# Patient Record
Sex: Male | Born: 1955 | Race: White | Hispanic: No | Marital: Married
Health system: Southern US, Community
[De-identification: ages and names within clinical notes are randomized; demographics above are authoritative.]

---

## 2006-07-20 ENCOUNTER — Ambulatory Visit: Payer: Self-pay | Admitting: Ophthalmology

## 2006-07-22 ENCOUNTER — Ambulatory Visit: Payer: Self-pay | Admitting: Ophthalmology

## 2006-11-14 IMAGING — CR DG CHEST 2V
1 series · 2 of 2 positions shown · non-contrast
Comparison: none

REASON FOR EXAM: possible sarcoidosis
COMMENTS:

[Series 1: view not recorded · 0.17mm/px · 2 of 2 slices shown]
[im 1/2]
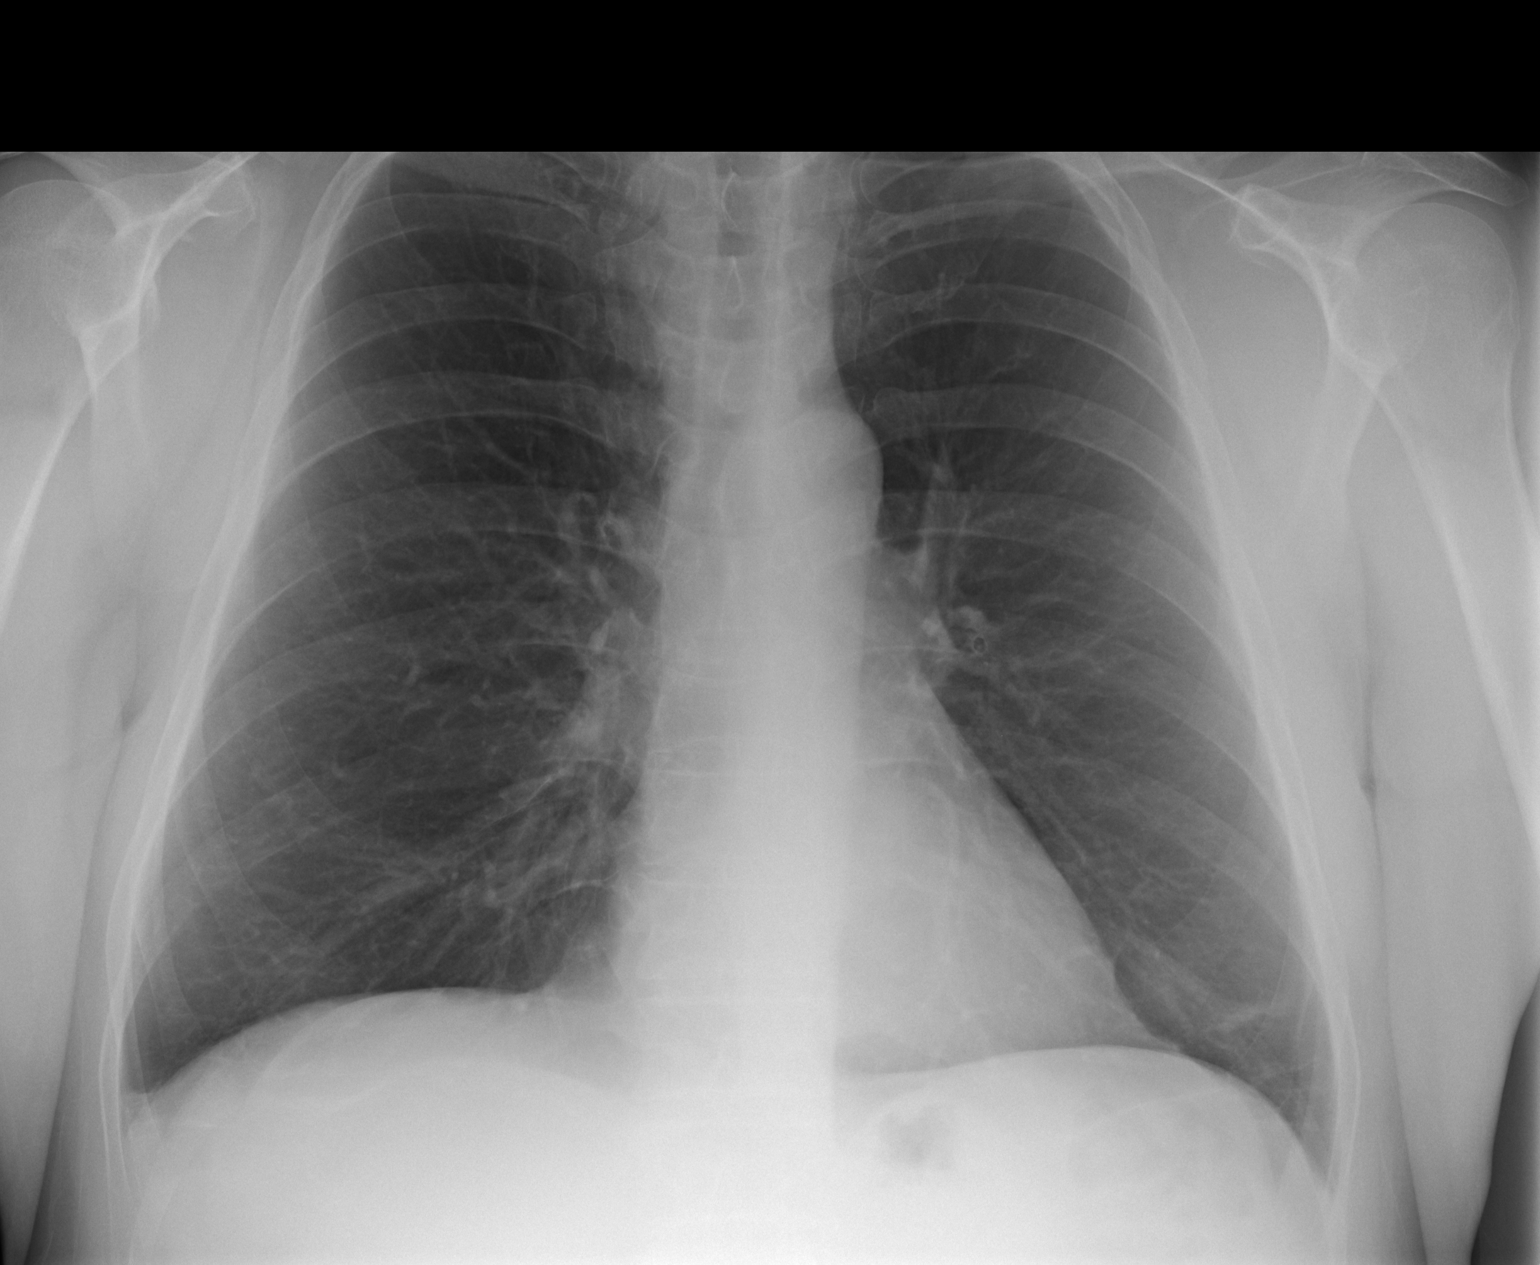
[im 2/2]
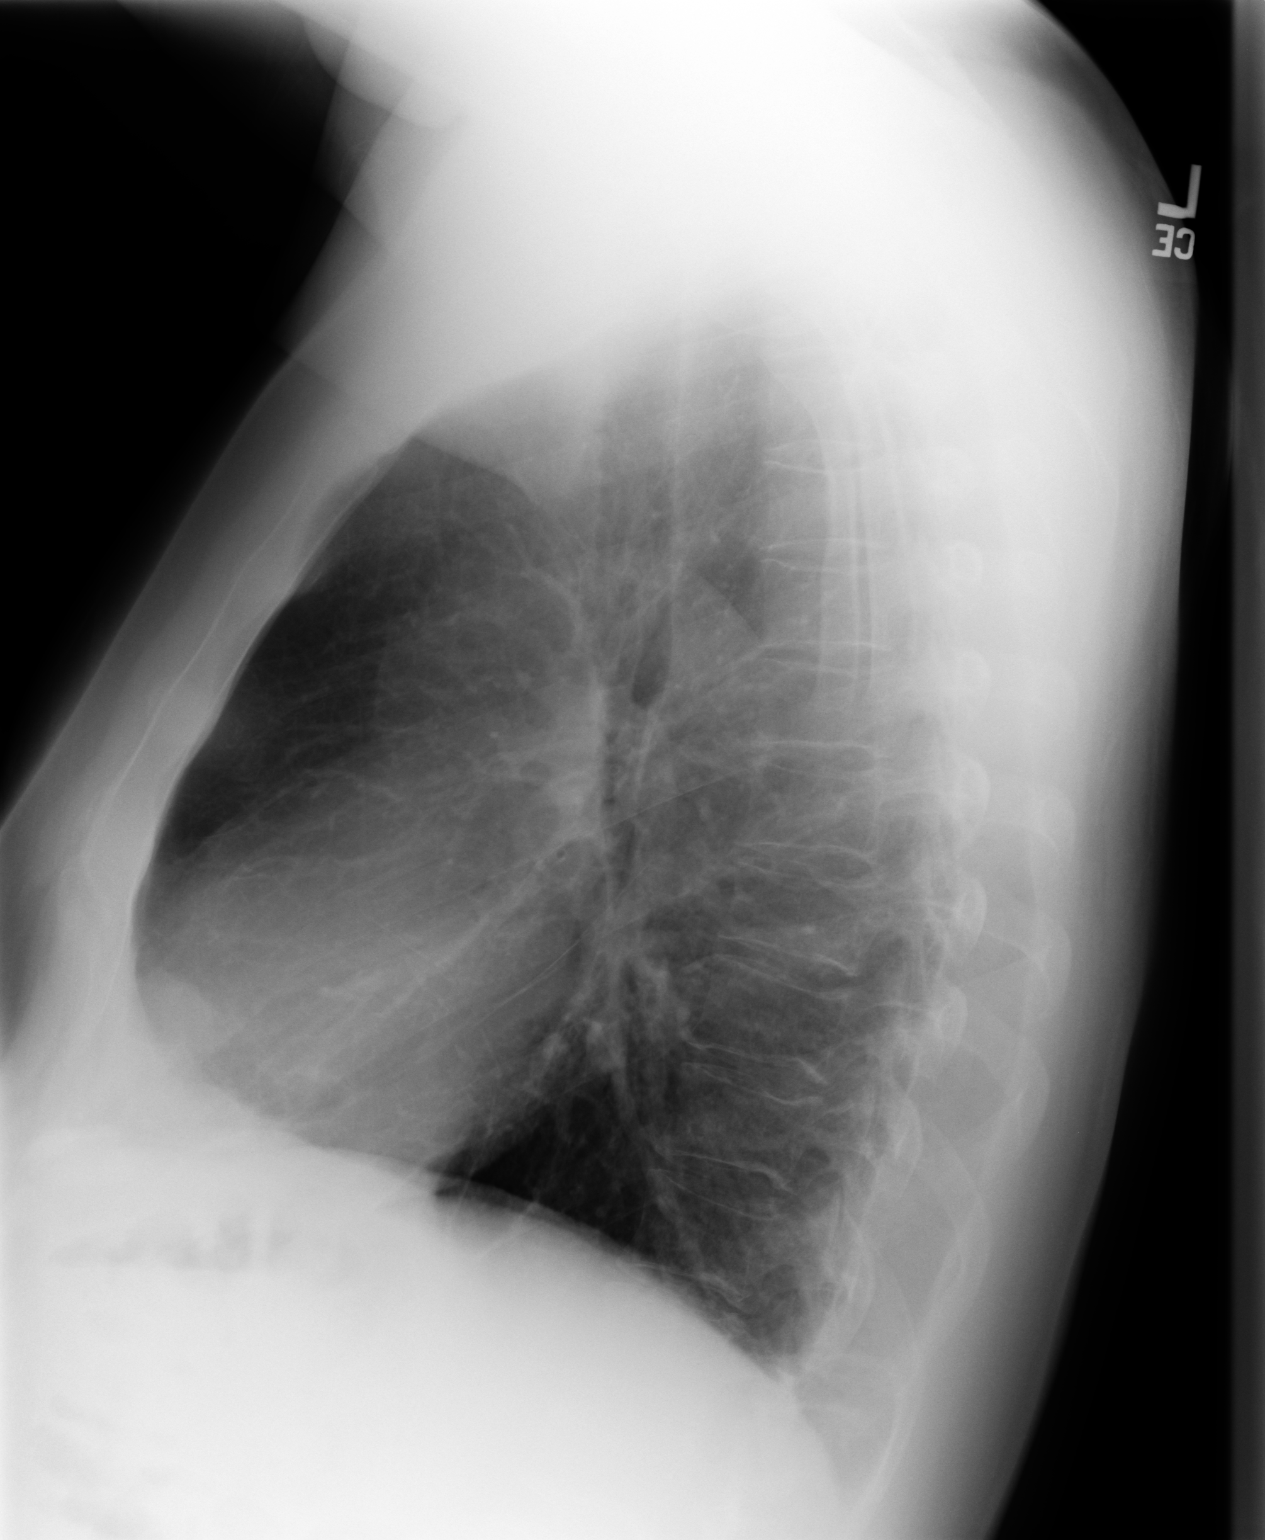

[2 of 2 positions shown; findings below may reference images not displayed]

PROCEDURE:     DXR - DXR CHEST PA (OR AP) AND LATERAL  - July 22, 2006 [DATE]

RESULT:          The lung fields are clear except for a transverse linear
density at the LEFT costophrenic angle compatible with slight fibrosis or
slight atelectasis.  No pneumonia, pneumothorax or pleural effusion is seen.
 The chest appears mildly hyperexpanded suspicious for a history of asthma
or COPD.  Attention to the hilar region shows no hilar adenopathy to suggest
sarcoidosis.  The heart and pulmonary vasculature are normal in appearance.
IMPRESSION: 1.     No hilar or paratracheal adenopathy is identified.
2.     There is slight increase in density at the LEFT base compatible with
atelectasis or fibrosis.
3.     The chest appears mildly hyperexpanded suspicious for a history of
COPD or asthma.

## 2007-11-14 ENCOUNTER — Ambulatory Visit: Payer: Self-pay | Admitting: Ophthalmology

## 2020-04-04 ENCOUNTER — Ambulatory Visit: Payer: Self-pay | Attending: Internal Medicine

## 2020-04-04 ENCOUNTER — Other Ambulatory Visit: Payer: Self-pay

## 2020-04-04 DIAGNOSIS — Z23 Encounter for immunization: Secondary | ICD-10-CM

## 2020-04-04 NOTE — Progress Notes (Signed)
   Covid-[facility] Vaccination [facility]  Name:  ALVAN CULPEPPER    MRN: [id number] DOB: Oct 19, 1956  04/04/2020  Mr. Latouche was observed post Covid-19 immunization for 15 minutes without incident. He was provided with Vaccine Information Sheet and instruction to access the V-Safe system.   Mr. Merry was instructed to call 911 with any severe reactions post vaccine: Marland Kitchen Difficulty breathing  . Swelling of face and throat  . A fast heartbeat  . A bad rash all over body  . Dizziness and weakness   Immunizations Administered    Name Date Dose VIS Date Route   Pfizer COVID-19 Vaccine 04/04/2020  3:21 PM 0.3 mL 12/07/2019 Intramuscular   Manufacturer: [employer], [employer]   Lot: [id number]   NDC: [address]-1

## 2020-05-03 ENCOUNTER — Ambulatory Visit: Payer: Self-pay | Attending: Internal Medicine

## 2020-05-03 DIAGNOSIS — Z23 Encounter for immunization: Secondary | ICD-10-CM

## 2020-05-03 NOTE — Progress Notes (Signed)
   Covid-19 Vaccination Clinic  Name:  Curtis Murray    MRN: [id number] DOB: 07-07-1956  05/03/2020  Curtis Murray was observed post Covid-19 immunization for 15 minutes without incident. He was provided with Vaccine Information Sheet and instruction to access the V-Safe system.   Curtis Murray was instructed to call 911 with any severe reactions post vaccine: Marland Kitchen Difficulty breathing  . Swelling of face and throat  . A fast heartbeat  . A bad rash all over body  . Dizziness and weakness   Immunizations Administered    Name Date Dose VIS Date Route   Pfizer COVID-19 Vaccine 05/03/2020  8:06 AM 0.3 mL 02/20/2019 Intramuscular   Manufacturer: [employer], [employer]   Lot: [id number]   NDC: [address]-4
